# Patient Record
Sex: Female | Born: 1955 | Race: White | Hispanic: No | Marital: Single | Smoking: Former smoker
Health system: Southern US, Community
[De-identification: ages and names within clinical notes are randomized; demographics above are authoritative.]

## PROBLEM LIST (undated history)

## (undated) DIAGNOSIS — I10 Essential (primary) hypertension: Secondary | ICD-10-CM

## (undated) DIAGNOSIS — E119 Type 2 diabetes mellitus without complications: Secondary | ICD-10-CM

## (undated) HISTORY — DX: Type 2 diabetes mellitus without complications: E11.9

## (undated) HISTORY — DX: Essential (primary) hypertension: I10

---

## 2016-12-27 ENCOUNTER — Other Ambulatory Visit: Payer: Self-pay | Admitting: Nurse Practitioner

## 2016-12-27 DIAGNOSIS — Z1231 Encounter for screening mammogram for malignant neoplasm of breast: Secondary | ICD-10-CM

## 2017-01-16 ENCOUNTER — Encounter: Payer: Self-pay | Admitting: Radiology

## 2017-01-16 ENCOUNTER — Ambulatory Visit
Admission: RE | Admit: 2017-01-16 | Discharge: 2017-01-16 | Disposition: A | Discharge status: Home / Self Care | Payer: BC Managed Care – PPO | Source: Ambulatory Visit | Attending: Nurse Practitioner | Admitting: Nurse Practitioner

## 2017-01-16 DIAGNOSIS — Z1231 Encounter for screening mammogram for malignant neoplasm of breast: Secondary | ICD-10-CM | POA: Diagnosis not present

## 2017-01-20 ENCOUNTER — Other Ambulatory Visit: Payer: Self-pay | Admitting: Nurse Practitioner

## 2017-01-20 DIAGNOSIS — R928 Other abnormal and inconclusive findings on diagnostic imaging of breast: Secondary | ICD-10-CM

## 2017-01-20 DIAGNOSIS — N6489 Other specified disorders of breast: Secondary | ICD-10-CM

## 2017-01-26 ENCOUNTER — Ambulatory Visit
Admission: RE | Admit: 2017-01-26 | Discharge: 2017-01-26 | Disposition: A | Discharge status: Home / Self Care | Payer: BC Managed Care – PPO | Source: Ambulatory Visit | Attending: Nurse Practitioner | Admitting: Nurse Practitioner

## 2017-01-26 DIAGNOSIS — N6001 Solitary cyst of right breast: Secondary | ICD-10-CM | POA: Insufficient documentation

## 2017-01-26 DIAGNOSIS — N6489 Other specified disorders of breast: Secondary | ICD-10-CM | POA: Diagnosis present

## 2017-01-26 DIAGNOSIS — R928 Other abnormal and inconclusive findings on diagnostic imaging of breast: Secondary | ICD-10-CM | POA: Diagnosis present

## 2017-01-26 DIAGNOSIS — N6002 Solitary cyst of left breast: Secondary | ICD-10-CM | POA: Insufficient documentation

## 2018-01-24 ENCOUNTER — Other Ambulatory Visit: Payer: Self-pay | Admitting: Nurse Practitioner

## 2018-01-24 DIAGNOSIS — Z1231 Encounter for screening mammogram for malignant neoplasm of breast: Secondary | ICD-10-CM

## 2018-01-29 ENCOUNTER — Ambulatory Visit
Admission: RE | Admit: 2018-01-29 | Discharge: 2018-01-29 | Disposition: A | Discharge status: Home / Self Care | Payer: BC Managed Care – PPO | Source: Ambulatory Visit | Attending: Nurse Practitioner | Admitting: Nurse Practitioner

## 2018-01-29 DIAGNOSIS — Z1231 Encounter for screening mammogram for malignant neoplasm of breast: Secondary | ICD-10-CM | POA: Diagnosis present

## 2019-03-21 ENCOUNTER — Other Ambulatory Visit: Payer: Self-pay | Admitting: Nurse Practitioner

## 2019-04-03 ENCOUNTER — Other Ambulatory Visit: Payer: Self-pay | Admitting: Nurse Practitioner

## 2019-04-03 DIAGNOSIS — Z1231 Encounter for screening mammogram for malignant neoplasm of breast: Secondary | ICD-10-CM

## 2019-10-23 ENCOUNTER — Ambulatory Visit
Admission: RE | Admit: 2019-10-23 | Discharge: 2019-10-23 | Disposition: A | Discharge status: Home / Self Care | Payer: BC Managed Care – PPO | Source: Ambulatory Visit | Attending: Nurse Practitioner | Admitting: Nurse Practitioner

## 2019-10-23 ENCOUNTER — Other Ambulatory Visit: Payer: Self-pay

## 2019-10-23 DIAGNOSIS — Z1231 Encounter for screening mammogram for malignant neoplasm of breast: Secondary | ICD-10-CM | POA: Insufficient documentation

## 2019-12-09 ENCOUNTER — Ambulatory Visit: Payer: BC Managed Care – PPO | Attending: Internal Medicine

## 2019-12-09 DIAGNOSIS — Z23 Encounter for immunization: Secondary | ICD-10-CM

## 2019-12-09 NOTE — Progress Notes (Signed)
   Covid-19 Vaccination Clinic  Name:  Sarah Perez    MRN: [id number] DOB: 13-Aug-1956  12/09/2019  [name] was observed post Covid-19 immunization for 15 minutes without incident. She was provided with Vaccine Information Sheet and instruction to access the V-Safe system.   [name] was instructed to call 911 with any severe reactions post vaccine: Marland Kitchen Difficulty breathing  . Swelling of face and throat  . A fast heartbeat  . A bad rash all over body  . Dizziness and weakness   Immunizations Administered    Name Date Dose VIS Date Route   Pfizer COVID-19 Vaccine 12/09/2019 10:46 AM 0.3 mL 08/23/2019 Intramuscular   Manufacturer: [employer], [employer]   Lot: [id number]   NDC: [address]-2

## 2020-01-01 ENCOUNTER — Ambulatory Visit: Payer: BC Managed Care – PPO | Attending: Internal Medicine

## 2020-01-01 DIAGNOSIS — Z23 Encounter for immunization: Secondary | ICD-10-CM

## 2020-01-01 NOTE — Progress Notes (Signed)
   Covid-19 Vaccination Clinic  Name:  Sarah Perez    MRN: [id number] DOB: 06-05-1956  01/01/2020  Sarah Perez was observed post Covid-19 immunization for 15 minutes without incident. She was provided with Vaccine Information Sheet and instruction to access the V-Safe system.   Sarah Perez was instructed to call 911 with any severe reactions post vaccine: Marland Kitchen Difficulty breathing  . Swelling of face and throat  . A fast heartbeat  . A bad rash all over body  . Dizziness and weakness   Immunizations Administered    Name Date Dose VIS Date Route   Pfizer COVID-19 Vaccine 01/01/2020  2:13 PM 0.3 mL 11/06/2018 Intramuscular   Manufacturer: [employer], [employer]   Lot: [id number]   NDC: [address]-5

## 2021-01-12 ENCOUNTER — Other Ambulatory Visit: Payer: Self-pay | Admitting: Nurse Practitioner

## 2021-01-12 DIAGNOSIS — Z1231 Encounter for screening mammogram for malignant neoplasm of breast: Secondary | ICD-10-CM

## 2021-01-20 ENCOUNTER — Ambulatory Visit
Admission: RE | Admit: 2021-01-20 | Discharge: 2021-01-20 | Disposition: A | Discharge status: Home / Self Care | Payer: Medicare PPO | Source: Ambulatory Visit | Attending: Nurse Practitioner | Admitting: Nurse Practitioner

## 2021-01-20 ENCOUNTER — Other Ambulatory Visit: Payer: Self-pay

## 2021-01-20 DIAGNOSIS — Z1231 Encounter for screening mammogram for malignant neoplasm of breast: Secondary | ICD-10-CM | POA: Insufficient documentation

## 2022-01-25 ENCOUNTER — Other Ambulatory Visit: Payer: Self-pay | Admitting: Nurse Practitioner

## 2022-01-25 DIAGNOSIS — Z1231 Encounter for screening mammogram for malignant neoplasm of breast: Secondary | ICD-10-CM

## 2022-02-09 ENCOUNTER — Ambulatory Visit
Admission: RE | Admit: 2022-02-09 | Discharge: 2022-02-09 | Disposition: A | Discharge status: Home / Self Care | Payer: Medicare PPO | Source: Ambulatory Visit | Attending: Nurse Practitioner | Admitting: Nurse Practitioner

## 2022-02-09 DIAGNOSIS — Z1231 Encounter for screening mammogram for malignant neoplasm of breast: Secondary | ICD-10-CM | POA: Insufficient documentation

## 2022-07-03 IMAGING — MG MM DIGITAL SCREENING BILAT W/ TOMO AND CAD
8 series · 8 of 24 positions shown · non-contrast
Comparison: Previous exam(s).

ACR Breast Density Category a: The breast tissue is almost entirely
fatty.

CLINICAL DATA: Screening.

EXAM:
DIGITAL SCREENING BILATERAL MAMMOGRAM WITH TOMOSYNTHESIS AND CAD
TECHNIQUE: Bilateral screening digital craniocaudal and mediolateral oblique
mammograms were obtained. Bilateral screening digital breast
tomosynthesis was performed. The images were evaluated with
computer-aided detection.

[R MLO synth-2D]
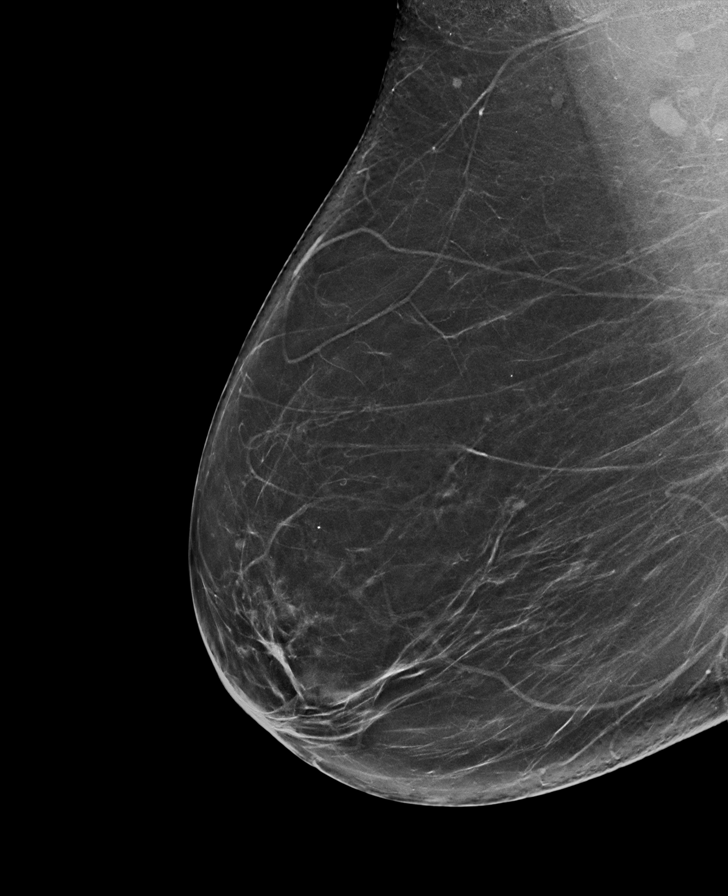

[L CC synth-2D]
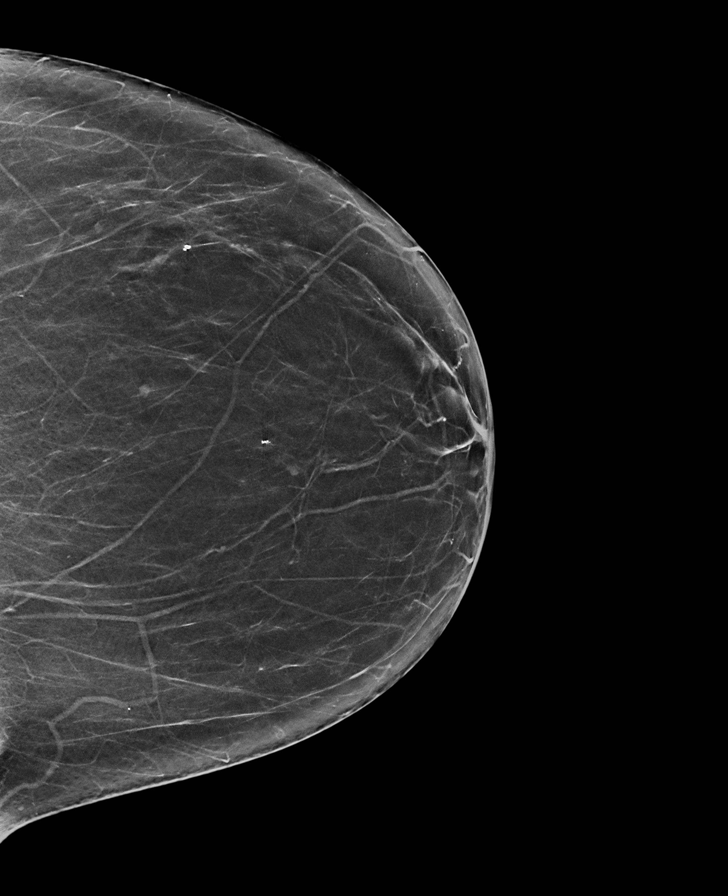

[R CC synth-2D]
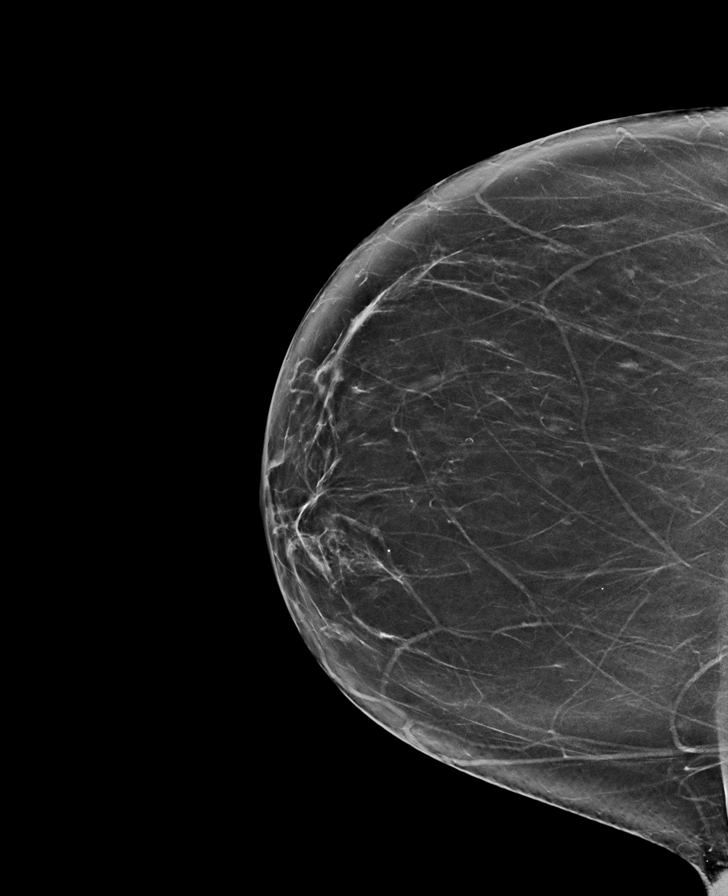

[L MLO synth-2D]
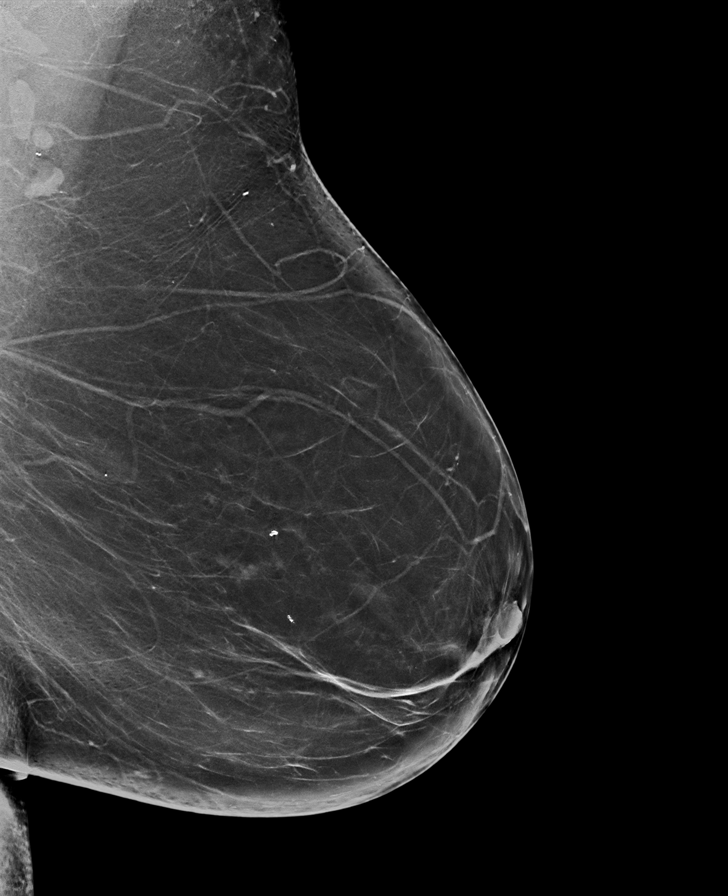

[L MLO tomo · tomo slice 41/82.0]
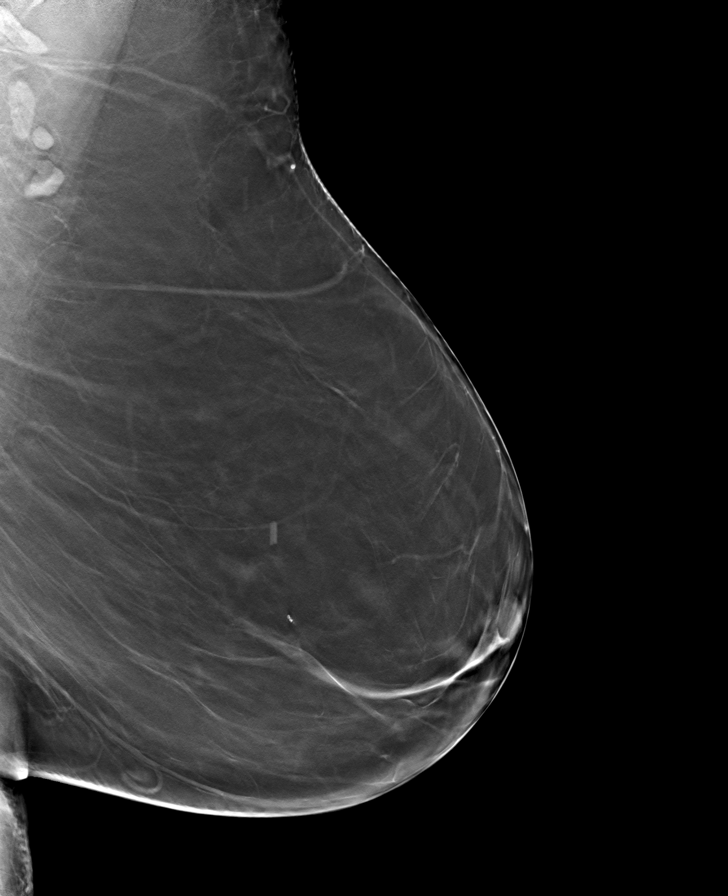

[R CC tomo · tomo slice 35/68.0]
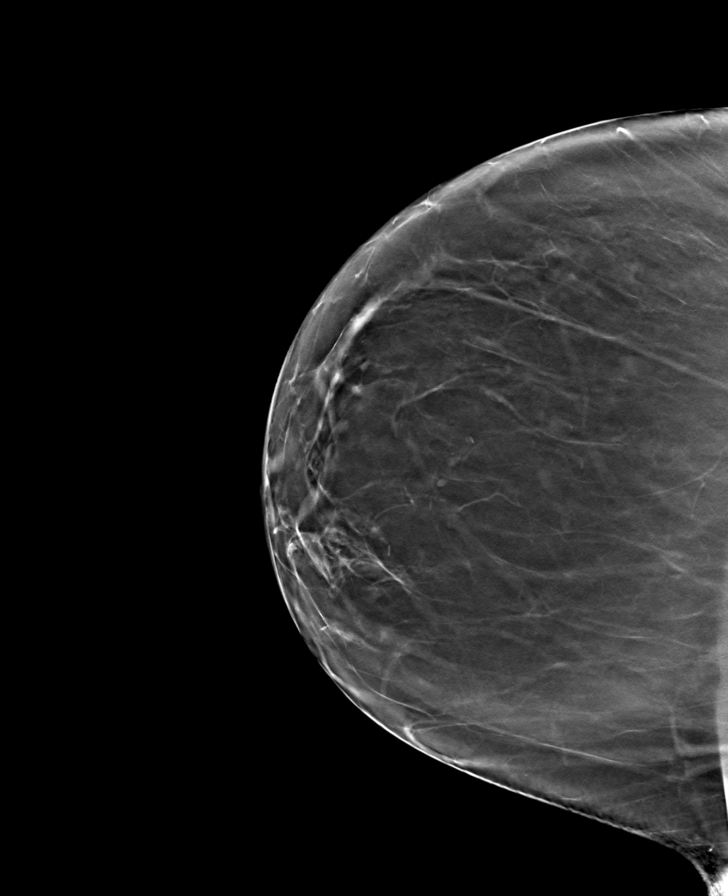

[R MLO tomo · tomo slice 39/77.0]
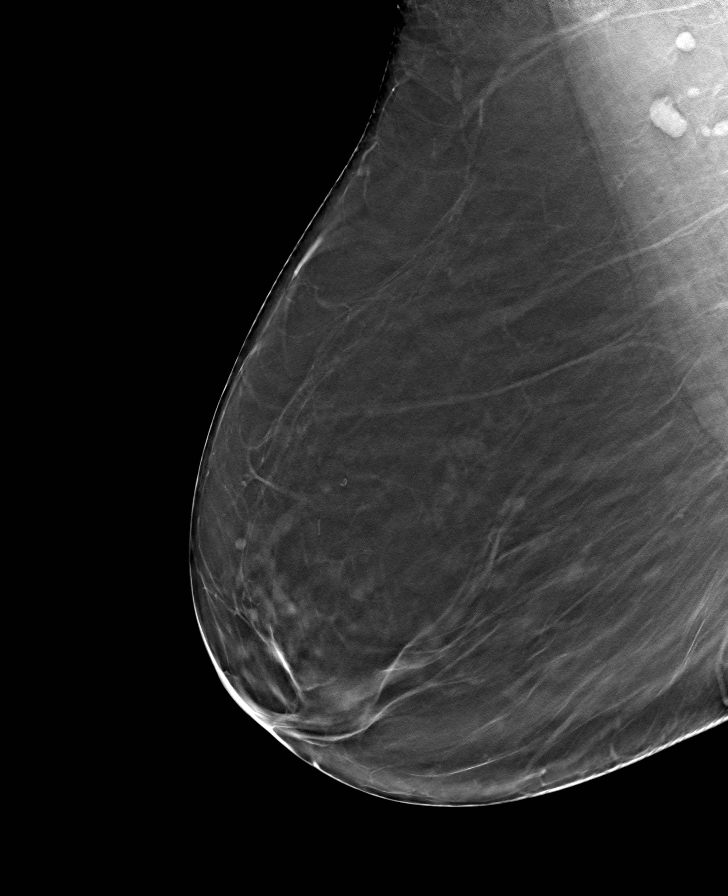

[L CC tomo · tomo slice 33/65.0]
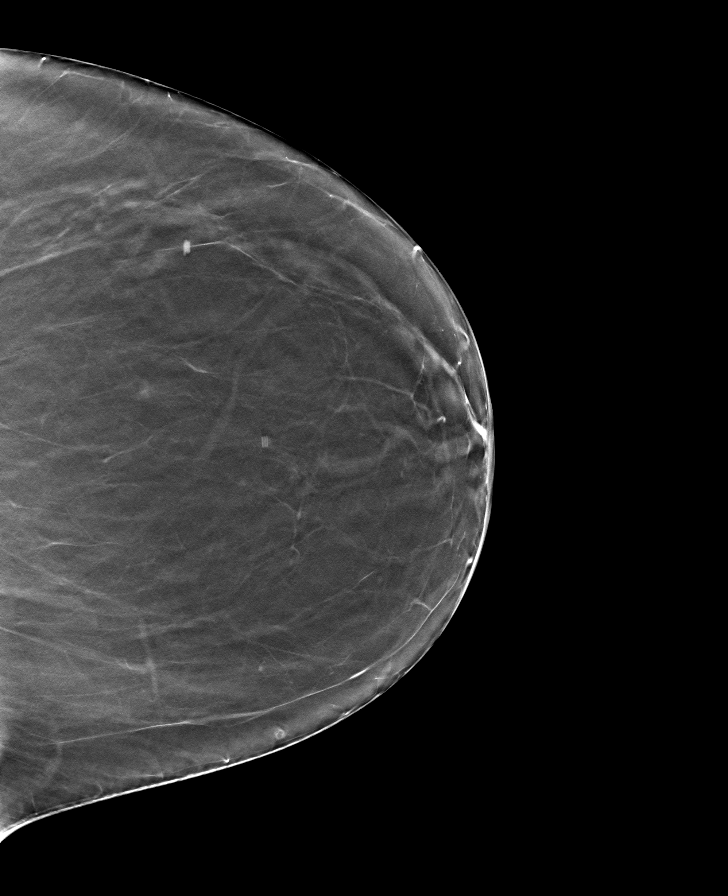

[8 of 24 positions shown; findings below may reference images not displayed]

FINDINGS: There are no findings suspicious for malignancy.
IMPRESSION: No mammographic evidence of malignancy. A result letter of this
screening mammogram will be mailed directly to the patient.

RECOMMENDATION:
Screening mammogram in one year. (Code:0E-3-N98)

BI-RADS CATEGORY  1: Negative.

## 2022-10-25 DIAGNOSIS — E119 Type 2 diabetes mellitus without complications: Secondary | ICD-10-CM | POA: Diagnosis not present

## 2022-11-25 DIAGNOSIS — E669 Obesity, unspecified: Secondary | ICD-10-CM | POA: Diagnosis not present

## 2022-11-25 DIAGNOSIS — E1129 Type 2 diabetes mellitus with other diabetic kidney complication: Secondary | ICD-10-CM | POA: Diagnosis not present

## 2022-11-25 DIAGNOSIS — M810 Age-related osteoporosis without current pathological fracture: Secondary | ICD-10-CM | POA: Diagnosis not present

## 2022-11-25 DIAGNOSIS — R809 Proteinuria, unspecified: Secondary | ICD-10-CM | POA: Diagnosis not present

## 2022-11-25 DIAGNOSIS — I10 Essential (primary) hypertension: Secondary | ICD-10-CM | POA: Diagnosis not present

## 2022-11-25 DIAGNOSIS — Z79899 Other long term (current) drug therapy: Secondary | ICD-10-CM | POA: Diagnosis not present

## 2022-11-25 DIAGNOSIS — E781 Pure hyperglyceridemia: Secondary | ICD-10-CM | POA: Diagnosis not present

## 2022-11-25 DIAGNOSIS — F4323 Adjustment disorder with mixed anxiety and depressed mood: Secondary | ICD-10-CM | POA: Diagnosis not present

## 2022-11-25 DIAGNOSIS — J4520 Mild intermittent asthma, uncomplicated: Secondary | ICD-10-CM | POA: Diagnosis not present

## 2022-12-28 ENCOUNTER — Other Ambulatory Visit: Payer: Self-pay | Admitting: Nurse Practitioner

## 2022-12-28 DIAGNOSIS — Z1231 Encounter for screening mammogram for malignant neoplasm of breast: Secondary | ICD-10-CM

## 2023-02-13 ENCOUNTER — Ambulatory Visit
Admission: RE | Admit: 2023-02-13 | Discharge: 2023-02-13 | Disposition: A | Discharge status: Home / Self Care | Payer: Medicare PPO | Source: Ambulatory Visit | Attending: Nurse Practitioner | Admitting: Nurse Practitioner

## 2023-02-13 DIAGNOSIS — Z1231 Encounter for screening mammogram for malignant neoplasm of breast: Secondary | ICD-10-CM | POA: Insufficient documentation

## 2023-05-19 ENCOUNTER — Ambulatory Visit
Admission: RE | Admit: 2023-05-19 | Discharge: 2023-05-19 | Disposition: A | Discharge status: Home / Self Care | Payer: Medicare PPO | Source: Ambulatory Visit

## 2023-05-19 ENCOUNTER — Ambulatory Visit (INDEPENDENT_AMBULATORY_CARE_PROVIDER_SITE_OTHER): Payer: Medicare PPO

## 2023-05-19 VITALS — BP 155/85 | HR 102 | Temp 98.6°F | Resp 14 | Ht 61.0 in | Wt 170.0 lb

## 2023-05-19 DIAGNOSIS — M25511 Pain in right shoulder: Secondary | ICD-10-CM | POA: Diagnosis not present

## 2023-05-19 DIAGNOSIS — S46911A Strain of unspecified muscle, fascia and tendon at shoulder and upper arm level, right arm, initial encounter: Secondary | ICD-10-CM

## 2023-05-19 MED ORDER — BACLOFEN 10 MG PO TABS: 10.0000 mg | ORAL_TABLET | Freq: Three times a day (TID) | ORAL | 0 refills | Status: AC

## 2023-05-19 MED ORDER — IBUPROFEN 600 MG PO TABS: 600.0000 mg | ORAL_TABLET | Freq: Four times a day (QID) | ORAL | 0 refills | Status: AC | PRN

## 2023-05-19 NOTE — ED Triage Notes (Signed)
Patient states that she fell last Saturday at [place] and landed on her knees.  Patient states that she did not have any pain until Wed.  Patient reports ongoing pain in her right shoulder.  Patient states that she is unable to raise her right arm without help.

## 2023-05-19 NOTE — Discharge Instructions (Addendum)
Your x-rays did not demonstrate any evidence of broken or dislocated bones.  I do believe that you have strained the muscles and this is what is causing your pain, especially when you try to move your right arm and shoulder.  Take the ibuprofen 600 mg every 6 hours with food to help with pain and inflammation.  Use the baclofen 10 mg every 8 hours to help with muscle tension and pain.  Apply moist heat to your shoulder for 20 minutes at a time 2-3 times a day to help improve blood flow and aid in pain relief and healing.  Follow the shoulder range of motion exercises given in the discharge instructions to help regain mobility and aid in pain relief.  If your symptoms not improve in the next 72 hours I recommend that you follow-up with orthopedics, such as [facility] on [address] here in [place].

## 2023-05-19 NOTE — ED Provider Notes (Signed)
[facility]    CSN: [id number] Arrival date & time: 05/19/23  1545      History   Chief Complaint Chief Complaint  Patient presents with   Shoulder Pain    Appointment    HPI Sarah Perez is a 67 y.o. female.   HPI  67 year old female with a past medical history significant for diabetes and hypertension presents for evaluation of right shoulder pain and limited range of motion.  She reports that she began having a tired feeling in her right arm Wednesday night after going grocery shopping and then playing on her computer game for several hours.  When she woke up yesterday morning she was unable to move her arm secondary to pain.  She denies any numbness or tingling in her fingers or weakness in her grip.  No injury or fall.  Past Medical History:  Diagnosis Date   Diabetes mellitus without complication (HCC)    Hypertension     There are no problems to display for this patient.   History reviewed. No pertinent surgical history.  OB History   No obstetric history on file.      Home Medications    Prior to Admission medications   Medication Sig Start Date End Date Taking? Authorizing Provider  alendronate (FOSAMAX) 70 MG tablet Take 70 mg by mouth once a week. 03/01/23  Yes [provider]  baclofen (LIORESAL) 10 MG tablet Take 1 tablet (10 mg total) by mouth 3 (three) times daily. 05/19/23  Yes Becky Augusta, NP  ibuprofen (ADVIL) 600 MG tablet Take 1 tablet (600 mg total) by mouth every 6 (six) hours as needed. 05/19/23  Yes Becky Augusta, NP  atorvastatin (LIPITOR) 10 MG tablet Take 10 mg by mouth daily.    [provider]  Cholecalciferol (VITAMIN D3) 50 MCG (2000 UT) capsule Take by mouth.    [provider]  lisinopril (ZESTRIL) 5 MG tablet Take 5 mg by mouth daily.    [provider]  metFORMIN (GLUCOPHAGE-XR) 500 MG 24 hr tablet Take 500 mg by mouth daily.    [provider]    Family  History Family History  Problem Relation Age of Onset   Breast cancer Neg Hx     Social History Social History   Tobacco Use   Smoking status: Former    Types: Cigarettes   Smokeless tobacco: Never  Vaping Use   Vaping status: Never Used  Substance Use Topics   Alcohol use: Not Currently   Drug use: Never     Allergies   Walnut and Codeine   Review of Systems Review of Systems  Musculoskeletal:  Positive for arthralgias and myalgias. Negative for joint swelling.  Skin:  Negative for color change.     Physical Exam Triage Vital Signs ED Triage Vitals  Encounter Vitals Group     BP      Systolic BP Percentile      Diastolic BP Percentile      Pulse      Resp      Temp      Temp src      SpO2      Weight      Height      Head Circumference      Peak Flow      Pain Score      Pain Loc      Pain Education      Exclude from Growth Chart    No  data found.  Updated Vital Signs BP (!) 155/85 (BP Location: Left Arm)   Pulse (!) 102   Temp 98.6 F (37 C) (Oral)   Resp 14   Ht 5\' 1"  (1.549 m)   Wt 170 lb (77.1 kg)   SpO2 98%   BMI 32.12 kg/m   Visual Acuity Right Eye Distance:   Left Eye Distance:   Bilateral Distance:    Right Eye Near:   Left Eye Near:    Bilateral Near:     Physical Exam Vitals and nursing note reviewed.  Constitutional:      Appearance: Normal appearance. She is not ill-appearing.  HENT:     Head: Normocephalic and atraumatic.  Musculoskeletal:        General: Tenderness present. No swelling or signs of injury.  Skin:    General: Skin is warm and dry.     Capillary Refill: Capillary refill takes less than 2 seconds.     Findings: No bruising.  Neurological:     General: No focal deficit present.     Mental Status: She is alert and oriented to person, place, and time.      UC Treatments / Results  Labs (all labs ordered are listed, but only abnormal results are displayed) Labs Reviewed - No data to  display  EKG   Radiology No results found.  Procedures Procedures (including critical care time)  Medications Ordered in UC Medications - No data to display  Initial Impression / Assessment and Plan / UC Course  I have reviewed the triage vital signs and the nursing notes.  Pertinent labs & imaging results that were available during my care of the patient were reviewed by me and considered in my medical decision making (see chart for details).   Patient is a pleasant, nontoxic-appearing 67 year old female presenting for evaluation of right shoulder pain and limited range of motion x 2 days without any known injury.  She has full range of motion of her forearm, wrist, hand, and fingers but she has limited abduction, extension, flexion, and internal rotation of the right shoulder.  She reports that the pain starts just above her right elbow and radiates up into her shoulder joint.  She has mild tenderness with palpation of her deltoid muscle and with palpation of the anterior aspect of the right glenohumeral joint.  No pain with palpation of the acromion, clavicle, or trapezius.  She also has mild tenderness with palpation of the body of the biceps muscle on the right-hand side.  I suspect that patient has a muscle strain which is causing her discomfort and limited range of motion.  I will obtain radiograph of the right shoulder to rule out any bony abnormality.  Right shoulder x-rays independently reviewed and evaluated by me.  Pression: No evidence of fracture or dislocation.  Radiology overread is pending.  I will discharge patient home with a diagnosis of right shoulder strain and treat her conservatively with ibuprofen, baclofen, moist heat, at home physical therapy.  If her pain does not improve in the next 72 hours I recommend that she follows up with orthopedics.   Final Clinical Impressions(s) / UC Diagnoses   Final diagnoses:  Strain of right shoulder, initial encounter      Discharge Instructions      Your x-rays did not demonstrate any evidence of broken or dislocated bones.  I do believe that you have strained the muscles and this is what is causing your pain, especially when you try  to move your right arm and shoulder.  Take the ibuprofen 600 mg every 6 hours with food to help with pain and inflammation.  Use the baclofen 10 mg every 8 hours to help with muscle tension and pain.  Apply moist heat to your shoulder for 20 minutes at a time 2-3 times a day to help improve blood flow and aid in pain relief and healing.  Follow the shoulder range of motion exercises given in the discharge instructions to help regain mobility and aid in pain relief.  If your symptoms not improve in the next 72 hours I recommend that you follow-up with orthopedics, such as [facility] on [address] here in [place].     ED Prescriptions     Medication Sig Dispense Auth. Provider   ibuprofen (ADVIL) 600 MG tablet Take 1 tablet (600 mg total) by mouth every 6 (six) hours as needed. 30 tablet Becky Augusta, NP   baclofen (LIORESAL) 10 MG tablet Take 1 tablet (10 mg total) by mouth 3 (three) times daily. 30 each Becky Augusta, NP      I have reviewed the PDMP during this encounter.   Becky Augusta, NP 05/19/23 1718

## 2023-06-02 DIAGNOSIS — Z23 Encounter for immunization: Secondary | ICD-10-CM | POA: Diagnosis not present

## 2023-06-02 DIAGNOSIS — E669 Obesity, unspecified: Secondary | ICD-10-CM | POA: Diagnosis not present

## 2023-06-02 DIAGNOSIS — Z1331 Encounter for screening for depression: Secondary | ICD-10-CM | POA: Diagnosis not present

## 2023-06-02 DIAGNOSIS — E781 Pure hyperglyceridemia: Secondary | ICD-10-CM | POA: Diagnosis not present

## 2023-06-02 DIAGNOSIS — M810 Age-related osteoporosis without current pathological fracture: Secondary | ICD-10-CM | POA: Diagnosis not present

## 2023-06-02 DIAGNOSIS — F4323 Adjustment disorder with mixed anxiety and depressed mood: Secondary | ICD-10-CM | POA: Diagnosis not present

## 2023-06-02 DIAGNOSIS — E119 Type 2 diabetes mellitus without complications: Secondary | ICD-10-CM | POA: Diagnosis not present

## 2023-06-02 DIAGNOSIS — J4520 Mild intermittent asthma, uncomplicated: Secondary | ICD-10-CM | POA: Diagnosis not present

## 2023-06-02 DIAGNOSIS — I10 Essential (primary) hypertension: Secondary | ICD-10-CM | POA: Diagnosis not present

## 2023-06-02 DIAGNOSIS — Z0000 Encounter for general adult medical examination without abnormal findings: Secondary | ICD-10-CM | POA: Diagnosis not present

## 2023-06-02 DIAGNOSIS — E1129 Type 2 diabetes mellitus with other diabetic kidney complication: Secondary | ICD-10-CM | POA: Diagnosis not present

## 2023-06-02 DIAGNOSIS — Z79899 Other long term (current) drug therapy: Secondary | ICD-10-CM | POA: Diagnosis not present

## 2023-06-15 DIAGNOSIS — M8588 Other specified disorders of bone density and structure, other site: Secondary | ICD-10-CM | POA: Diagnosis not present

## 2023-11-28 DIAGNOSIS — M810 Age-related osteoporosis without current pathological fracture: Secondary | ICD-10-CM | POA: Diagnosis not present

## 2023-11-28 DIAGNOSIS — E119 Type 2 diabetes mellitus without complications: Secondary | ICD-10-CM | POA: Diagnosis not present

## 2023-11-28 DIAGNOSIS — I10 Essential (primary) hypertension: Secondary | ICD-10-CM | POA: Diagnosis not present

## 2023-11-28 DIAGNOSIS — E781 Pure hyperglyceridemia: Secondary | ICD-10-CM | POA: Diagnosis not present

## 2023-12-14 DIAGNOSIS — E119 Type 2 diabetes mellitus without complications: Secondary | ICD-10-CM | POA: Diagnosis not present

## 2024-01-03 ENCOUNTER — Other Ambulatory Visit: Payer: Self-pay | Admitting: Nurse Practitioner

## 2024-01-03 DIAGNOSIS — Z1231 Encounter for screening mammogram for malignant neoplasm of breast: Secondary | ICD-10-CM

## 2024-01-26 DIAGNOSIS — Z860101 Personal history of adenomatous and serrated colon polyps: Secondary | ICD-10-CM | POA: Diagnosis not present

## 2024-01-26 DIAGNOSIS — Z01818 Encounter for other preprocedural examination: Secondary | ICD-10-CM | POA: Diagnosis not present

## 2024-02-14 ENCOUNTER — Ambulatory Visit

## 2024-02-19 ENCOUNTER — Ambulatory Visit
Admission: RE | Admit: 2024-02-19 | Discharge: 2024-02-19 | Disposition: A | Discharge status: Home / Self Care | Source: Ambulatory Visit | Attending: Nurse Practitioner | Admitting: Nurse Practitioner

## 2024-02-19 DIAGNOSIS — Z1231 Encounter for screening mammogram for malignant neoplasm of breast: Secondary | ICD-10-CM | POA: Insufficient documentation

## 2024-02-21 ENCOUNTER — Ambulatory Visit: Payer: Self-pay

## 2024-02-21 DIAGNOSIS — Z860101 Personal history of adenomatous and serrated colon polyps: Secondary | ICD-10-CM | POA: Diagnosis not present

## 2024-02-21 DIAGNOSIS — K5730 Diverticulosis of large intestine without perforation or abscess without bleeding: Secondary | ICD-10-CM | POA: Diagnosis not present

## 2024-02-21 DIAGNOSIS — D120 Benign neoplasm of cecum: Secondary | ICD-10-CM | POA: Diagnosis not present

## 2024-02-21 DIAGNOSIS — Z1211 Encounter for screening for malignant neoplasm of colon: Secondary | ICD-10-CM | POA: Diagnosis not present

## 2024-02-21 DIAGNOSIS — K641 Second degree hemorrhoids: Secondary | ICD-10-CM | POA: Diagnosis not present

## 2024-03-07 DIAGNOSIS — M810 Age-related osteoporosis without current pathological fracture: Secondary | ICD-10-CM | POA: Diagnosis not present

## 2024-03-07 DIAGNOSIS — I10 Essential (primary) hypertension: Secondary | ICD-10-CM | POA: Diagnosis not present

## 2024-03-07 DIAGNOSIS — E119 Type 2 diabetes mellitus without complications: Secondary | ICD-10-CM | POA: Diagnosis not present

## 2024-03-07 DIAGNOSIS — E781 Pure hyperglyceridemia: Secondary | ICD-10-CM | POA: Diagnosis not present

## 2024-05-09 DIAGNOSIS — I10 Essential (primary) hypertension: Secondary | ICD-10-CM | POA: Diagnosis not present

## 2024-05-09 DIAGNOSIS — E119 Type 2 diabetes mellitus without complications: Secondary | ICD-10-CM | POA: Diagnosis not present

## 2024-05-09 DIAGNOSIS — M810 Age-related osteoporosis without current pathological fracture: Secondary | ICD-10-CM | POA: Diagnosis not present

## 2024-05-09 DIAGNOSIS — E781 Pure hyperglyceridemia: Secondary | ICD-10-CM | POA: Diagnosis not present

## 2024-05-30 DIAGNOSIS — Z1331 Encounter for screening for depression: Secondary | ICD-10-CM | POA: Diagnosis not present

## 2024-05-30 DIAGNOSIS — E119 Type 2 diabetes mellitus without complications: Secondary | ICD-10-CM | POA: Diagnosis not present

## 2024-05-30 DIAGNOSIS — M810 Age-related osteoporosis without current pathological fracture: Secondary | ICD-10-CM | POA: Diagnosis not present

## 2024-05-30 DIAGNOSIS — I10 Essential (primary) hypertension: Secondary | ICD-10-CM | POA: Diagnosis not present

## 2024-05-30 DIAGNOSIS — Z23 Encounter for immunization: Secondary | ICD-10-CM | POA: Diagnosis not present

## 2024-05-30 DIAGNOSIS — E781 Pure hyperglyceridemia: Secondary | ICD-10-CM | POA: Diagnosis not present
